# Patient Record
Sex: Male | Born: 1985 | Hispanic: No | Marital: Married | Smoking: Never smoker
Health system: Southern US, Community
[De-identification: ages and names within clinical notes are randomized; demographics above are authoritative.]

---

## 2015-01-25 ENCOUNTER — Emergency Department (HOSPITAL_COMMUNITY): Payer: Worker's Compensation

## 2015-01-25 ENCOUNTER — Encounter (HOSPITAL_COMMUNITY): Payer: Self-pay | Admitting: *Deleted

## 2015-01-25 ENCOUNTER — Emergency Department (HOSPITAL_COMMUNITY)
Admission: EM | Admit: 2015-01-25 | Discharge: 2015-01-25 | Disposition: A | Discharge status: Home / Self Care | Payer: Worker's Compensation | Attending: Emergency Medicine | Admitting: Emergency Medicine

## 2015-01-25 DIAGNOSIS — I861 Scrotal varices: Secondary | ICD-10-CM | POA: Diagnosis not present

## 2015-01-25 DIAGNOSIS — R52 Pain, unspecified: Secondary | ICD-10-CM

## 2015-01-25 DIAGNOSIS — N50812 Left testicular pain: Secondary | ICD-10-CM | POA: Diagnosis present

## 2015-01-25 LAB — URINALYSIS, ROUTINE W REFLEX MICROSCOPIC
Bilirubin Urine: NEGATIVE
GLUCOSE, UA: NEGATIVE mg/dL
Hgb urine dipstick: NEGATIVE
Ketones, ur: NEGATIVE mg/dL
LEUKOCYTES UA: NEGATIVE
NITRITE: NEGATIVE
PROTEIN: NEGATIVE mg/dL
Specific Gravity, Urine: 1.021 (ref 1.005–1.030)
pH: 6 (ref 5.0–8.0)

## 2015-01-25 MED ORDER — NAPROXEN 500 MG PO TABS: 500.0000 mg | ORAL_TABLET | Freq: Two times a day (BID) | ORAL | Status: AC

## 2015-01-25 MED ORDER — OXYCODONE-ACETAMINOPHEN 5-325 MG PO TABS: 1.0000 | ORAL_TABLET | Freq: Three times a day (TID) | ORAL | Status: AC | PRN

## 2015-01-25 MED ORDER — OXYCODONE-ACETAMINOPHEN 5-325 MG PO TABS
1.0000 | ORAL_TABLET | Freq: Once | ORAL | Status: AC
  Administered 2015-01-25: 1 via ORAL
  Filled 2015-01-25: qty 1

## 2015-01-25 NOTE — ED Notes (Signed)
Pt sent to ED from [facility]. Pt states he was hit in his left groin area by a door he was unloading from a truck at work today. Pt states the pain has moved from his left groin to his left testicle. Pt states he can feel a mass in his scrotum when palpating. Pt denies any urinary symptoms since the injury.

## 2015-01-25 NOTE — Discharge Instructions (Signed)
1. Medications: naproxen is your anti-inflammatory, percocet for pain - This can make you very drowsy - please do not drink or drive on this medication, continue usual home medications 2. Treatment: rest, scrotal support, ice affected area as needed 3. Follow Up: Please follow up with the urology clinic listed for discussion of your diagnoses and further evaluation after today's visit if pain worsens or does not improve after 4-5 days; Please return to the ER for any new or worsening symptoms, any additional concerns.

## 2015-01-25 NOTE — ED Notes (Signed)
Bed: WA20 Expected date:  Expected time:  Means of arrival:  Comments: Hold for triage 1 

## 2015-01-25 NOTE — ED Provider Notes (Signed)
CSN: [id number]     Arrival date & time 01/25/15  1310 History   First MD Initiated Contact with Patient 01/25/15 1332     Chief Complaint  Patient presents with  . Groin Pain     (Consider location/radiation/quality/duration/timing/severity/associated sxs/prior Treatment) Patient is a 30 y.o. male presenting with groin pain. The history is provided by the patient and medical records. No language interpreter was used.  Groin Pain Pertinent negatives include no abdominal pain, arthralgias, coughing, headaches, myalgias, nausea, neck pain, rash, vomiting or weakness.    [place] is a 30 y.o. male  with no pertinent PMH who presents to the Emergency Department complaining of worsening left testicular pain after an injury on Friday 13th. Patient states he was unloading doors from a work truck when his partner pushed door into his left groin. Initially thought he could tolerate pain at home, but pain is getting worse. Denies hematuria, dysuria, abdominal pain, n/v. No GU PMH.   History reviewed. No pertinent past medical history. History reviewed. No pertinent past surgical history. No family history on file. Social History  Substance Use Topics  . Smoking status: Never Smoker   . Smokeless tobacco: None  . Alcohol Use: No    Review of Systems  Constitutional: Negative.   Respiratory: Negative for cough, shortness of breath and wheezing.   Cardiovascular: Negative.   Gastrointestinal: Negative for nausea, vomiting, abdominal pain, diarrhea and constipation.  Genitourinary: Positive for scrotal swelling and testicular pain.  Musculoskeletal: Negative for myalgias, back pain, arthralgias and neck pain.  Skin: Negative for rash.  Allergic/Immunologic: Negative for immunocompromised state.  Neurological: Negative for dizziness, weakness and headaches.  Hematological: Does not bruise/bleed easily.      Allergies  Review of patient's allergies indicates no known  allergies.  Home Medications   Prior to Admission medications   Medication Sig Start Date End Date Taking? Authorizing Provider  ibuprofen (ADVIL,MOTRIN) 200 MG tablet Take 400 mg by mouth every 8 (eight) hours as needed for mild pain or moderate pain.   Yes Historical Provider, MD  naproxen (NAPROSYN) 500 MG tablet Take 1 tablet (500 mg total) by mouth 2 (two) times daily. 01/25/15   Chase Picket Cyrstal Leitz, PA-C  oxyCODONE-acetaminophen (PERCOCET/ROXICET) 5-325 MG tablet Take 1 tablet by mouth every 8 (eight) hours as needed for severe pain. 01/25/15   Emmalee Solivan Pilcher Steen Bisig, PA-C   BP 129/82 mmHg  Pulse 81  Temp(Src) 97.5 F (36.4 C) (Oral)  Resp 18  SpO2 99% Physical Exam  Constitutional: He is oriented to person, place, and time. He appears well-developed and well-nourished.  Alert and in no acute distress  HENT:  Head: Normocephalic and atraumatic.  Cardiovascular: Normal rate, regular rhythm and normal heart sounds.  Exam reveals no gallop and no friction rub.   No murmur heard. Pulmonary/Chest: Effort normal and breath sounds normal. No respiratory distress. He has no wheezes. He has no rales. He exhibits no tenderness.  Abdominal: Soft. He exhibits no distension and no mass. There is no tenderness. There is no rebound and no guarding. Hernia confirmed negative in the right inguinal area and confirmed negative in the left inguinal area.  Genitourinary: Penis normal. Cremasteric reflex is present. Right testis shows no mass, no swelling and no tenderness. Left testis shows swelling and tenderness. Left testis shows no mass.  GU exam performed with chaperone.   Musculoskeletal: He exhibits no edema.  Lymphadenopathy:       Right: No inguinal adenopathy present.  Left: No inguinal adenopathy present.  Neurological: He is alert and oriented to person, place, and time.  Skin: Skin is warm and dry. No rash noted.  Psychiatric: He has a normal mood and affect. His behavior is normal.  Judgment and thought content normal.  Nursing note and vitals reviewed.   ED Course  Procedures (including critical care time) Labs Review Labs Reviewed  URINALYSIS, ROUTINE W REFLEX MICROSCOPIC (NOT AT [facility])    Imaging Review US Scrotum  01/25/2015  CLINICAL DATA:  Acute left testicular pain. EXAM: SCROTAL ULTRASOUND DOPPLER ULTRASOUND OF THE TESTICLES TECHNIQUE: Complete ultrasound examination of the testicles, epididymis, and other scrotal structures was performed. Color and spectral Doppler ultrasound were also utilized to evaluate blood flow to the testicles. COMPARISON:  None. FINDINGS: Right testicle Measurements: 4.3 x 2.9 x 2.7 cm. No mass or microlithiasis visualized. Left testicle Measurements: 3.9 x 2.7 x 2.1 cm. No mass or microlithiasis visualized. Right epididymis: 3 mm cyst is noted. Otherwise normal in size and appearance. Left epididymis:  Normal in size and appearance. Hydrocele:  None visualized. Varicocele:  Large left varicocele is noted. Pulsed Doppler interrogation of both testes demonstrates normal low resistance arterial and venous waveforms bilaterally. IMPRESSION: Large left varicocele is noted. No evidence of testicular torsion or mass. Electronically Signed   By: Lupita Raider, M.D.   On: 01/25/2015 14:48   [place] Art/ven Flow Abd Pelv Doppler  01/25/2015  CLINICAL DATA:  Acute left testicular pain. EXAM: SCROTAL ULTRASOUND DOPPLER ULTRASOUND OF THE TESTICLES TECHNIQUE: Complete ultrasound examination of the testicles, epididymis, and other scrotal structures was performed. Color and spectral Doppler ultrasound were also utilized to evaluate blood flow to the testicles. COMPARISON:  None. FINDINGS: Right testicle Measurements: 4.3 x 2.9 x 2.7 cm. No mass or microlithiasis visualized. Left testicle Measurements: 3.9 x 2.7 x 2.1 cm. No mass or microlithiasis visualized. Right epididymis: 3 mm cyst is noted. Otherwise normal in size and appearance. Left epididymis:  Normal in  size and appearance. Hydrocele:  None visualized. Varicocele:  Large left varicocele is noted. Pulsed Doppler interrogation of both testes demonstrates normal low resistance arterial and venous waveforms bilaterally. IMPRESSION: Large left varicocele is noted. No evidence of testicular torsion or mass. Electronically Signed   By: Lupita Raider, M.D.   On: 01/25/2015 14:48   I have personally reviewed and evaluated these images and lab results as part of my medical decision-making.   EKG Interpretation None      MDM   Final diagnoses:  Left varicocele   Trevante Klimaszewski presents with worsening left groin/testicular pain after injury on 1/13.  Imaging: Scrotal ultrasound shows large left varicocele, no evidence of torsion or mass.   Therapeutics: Pain control in ED  A&P: Left varicocele  - Naproxen, scrotal support  - Urology referral given  - Follow up instructions, return precautions, and home care instructions given: all questions answered  Patient discussed with Dr. Freida Busman who agrees with treatment plan.   [facility] Demarquis Osley, PA-C 01/25/15 1535  Lorre Nick, MD 01/30/15 [phone]

## 2017-05-12 IMAGING — US US SCROTUM
1 series · 14 of 25 positions shown · non-contrast
Comparison: None.

CLINICAL DATA: Acute left testicular pain.

EXAM:
SCROTAL ULTRASOUND
DOPPLER ULTRASOUND OF THE TESTICLES
TECHNIQUE: Complete ultrasound examination of the testicles, epididymis, and
other scrotal structures was performed. Color and spectral Doppler
ultrasound were also utilized to evaluate blood flow to the
testicles.

[Series 1: us scrotum · 0.07mm/px · 14 of 54 slices shown]
[im 1/54]
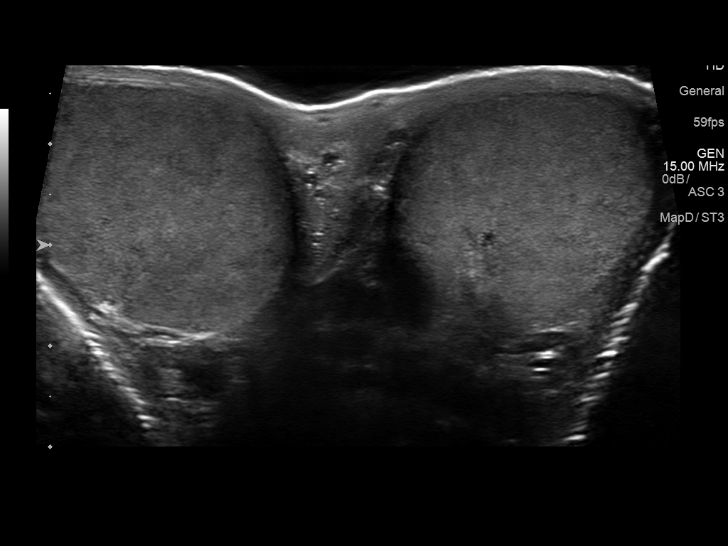
[im 5/54]
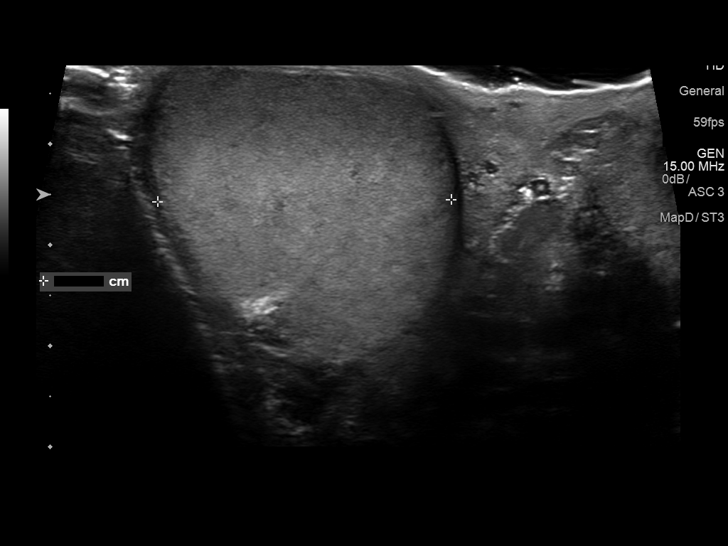
[im 9/54]
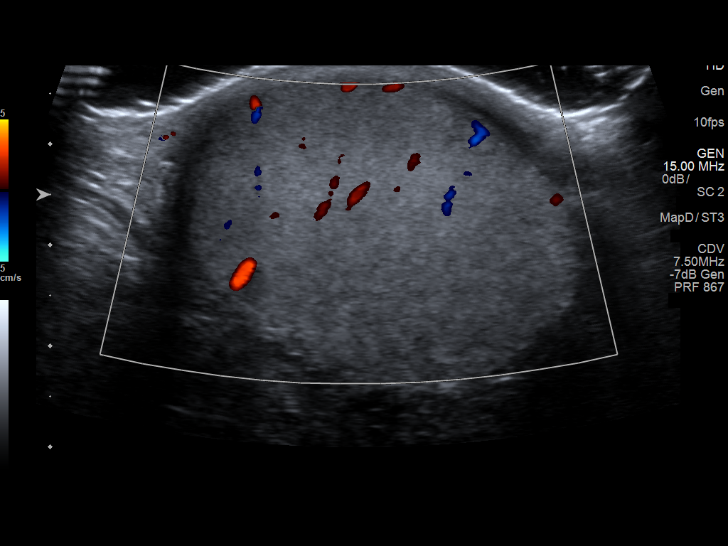
[im 14/54]
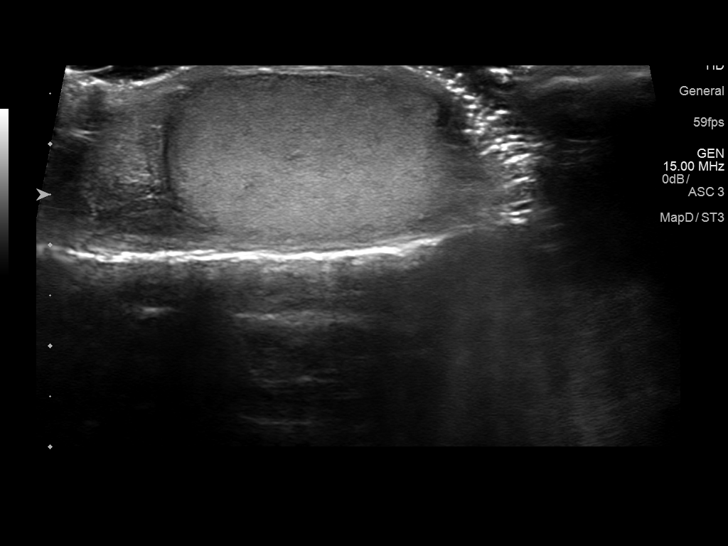
[im 18/54]
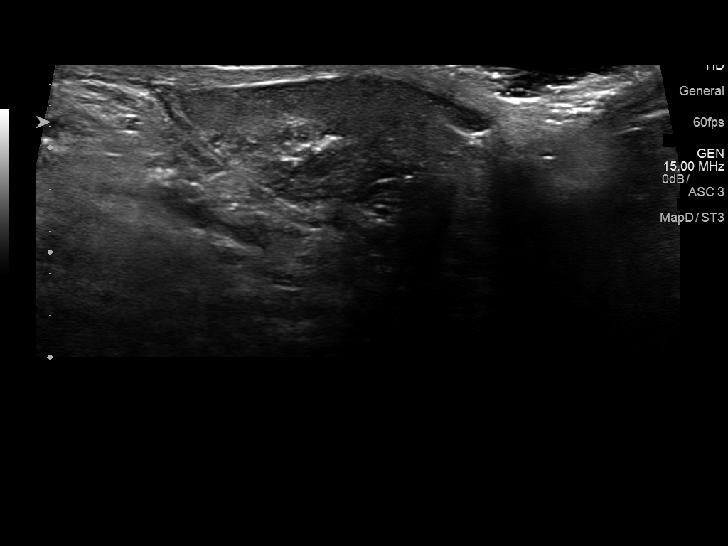
[im 20/54]
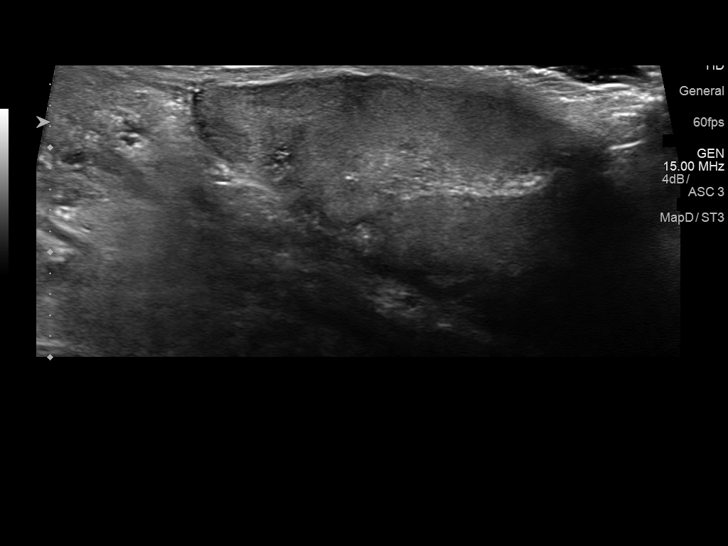
[im 25/54]
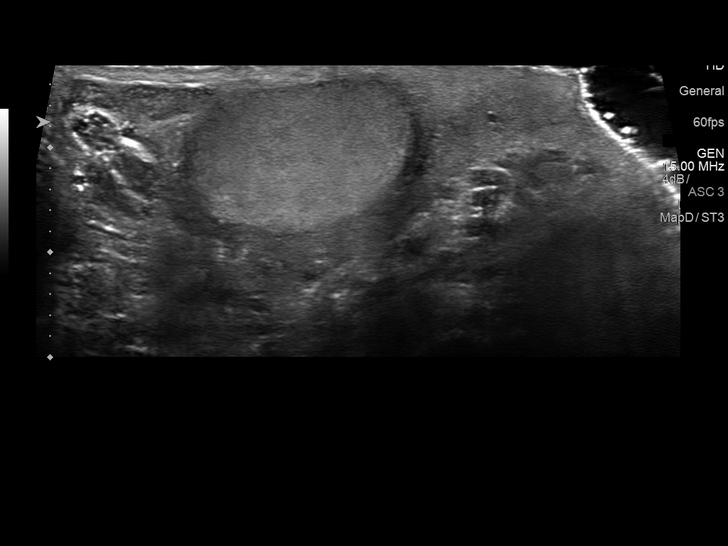
[im 29/54]
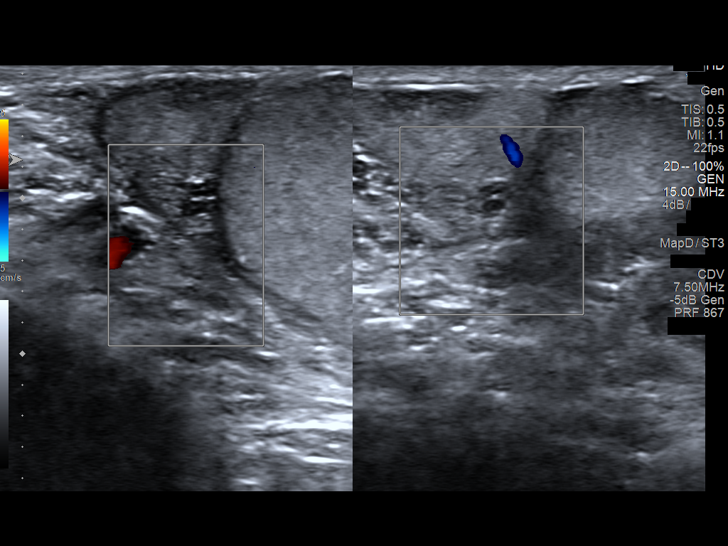
[im 34/54]
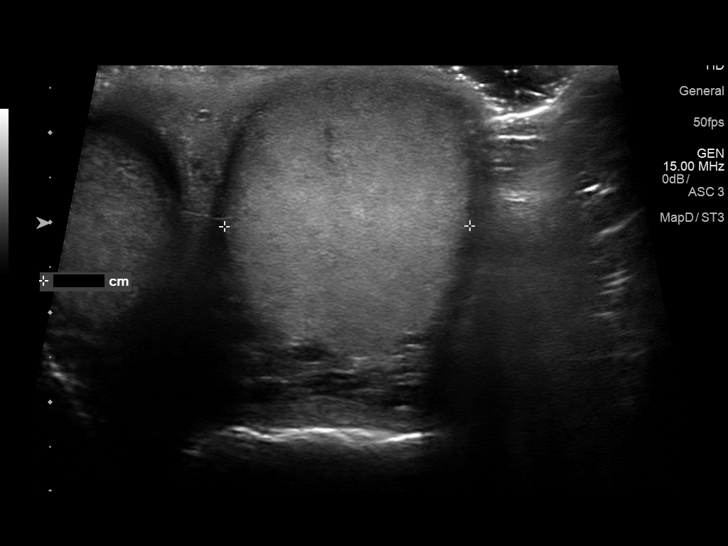
[im 36/54]
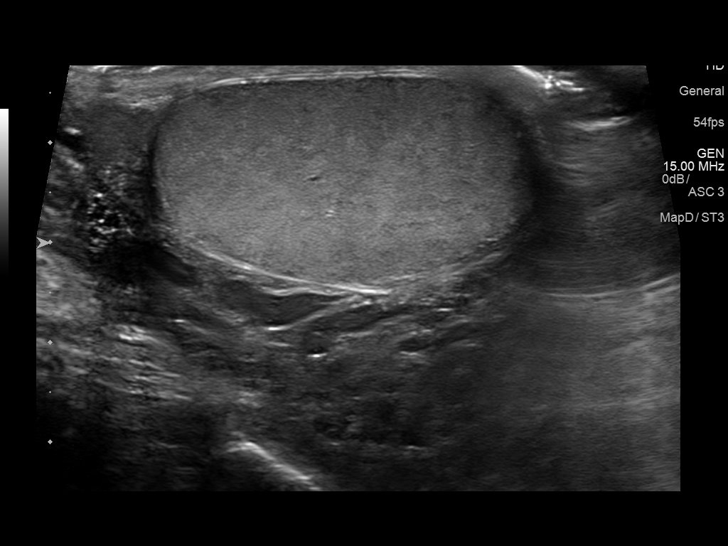
[im 40/54]
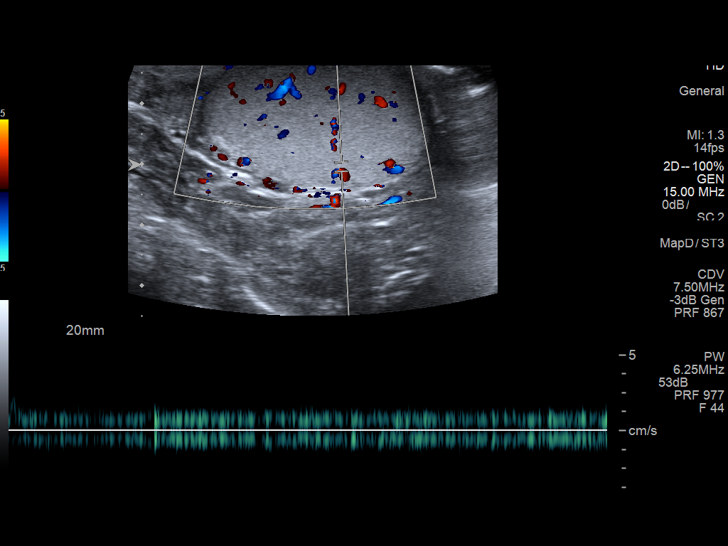
[im 45/54]
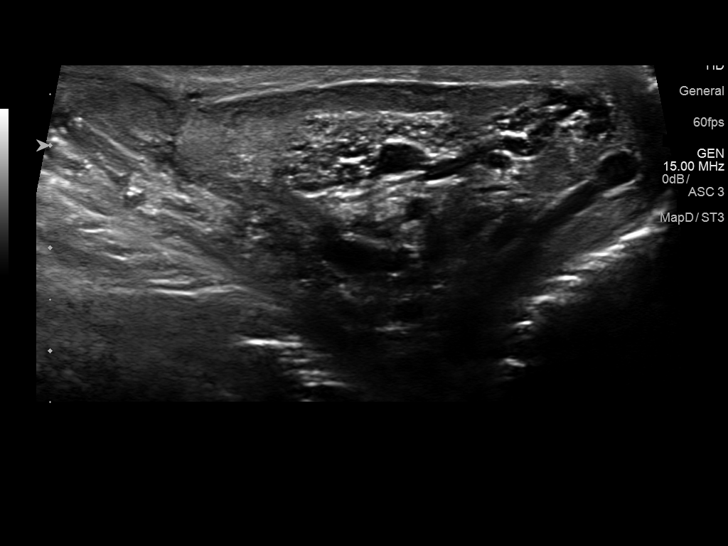
[im 49/54]
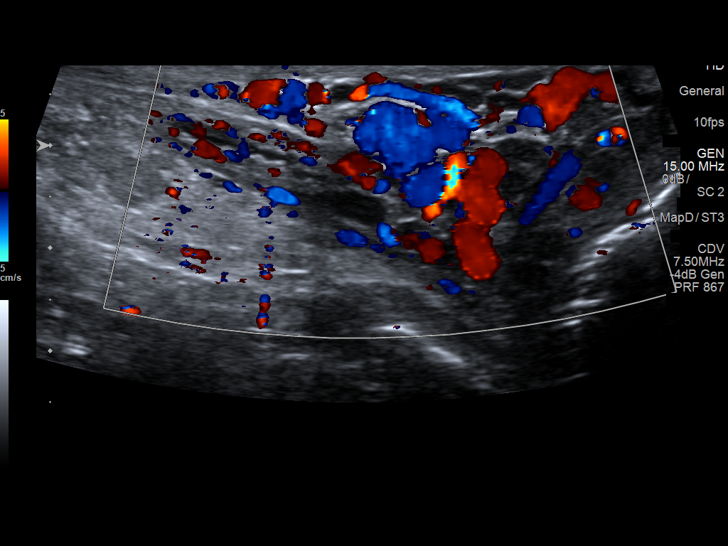
[im 54/54]
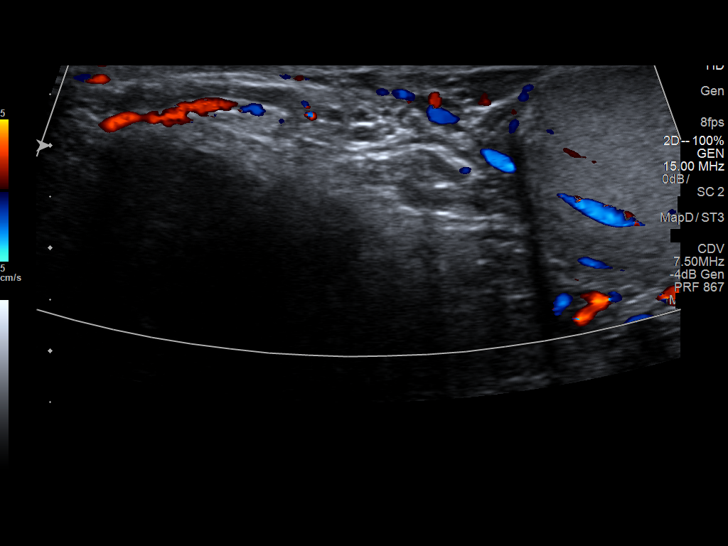

[14 of 25 positions shown; findings below may reference images not displayed]

FINDINGS: Right testicle

Measurements: 4.3 x 2.9 x 2.7 cm. No mass or microlithiasis
visualized.

Left testicle

Measurements: 3.9 x 2.7 x 2.1 cm. No mass or microlithiasis
visualized.

Right epididymis: 3 mm cyst is noted. Otherwise normal in size and
appearance.

Left epididymis:  Normal in size and appearance.

Hydrocele:  None visualized.

Varicocele:  Large left varicocele is noted.

Pulsed Doppler interrogation of both testes demonstrates normal low
resistance arterial and venous waveforms bilaterally.
IMPRESSION: Large left varicocele is noted. No evidence of testicular torsion or
mass.
